# Patient Record
Sex: Female | Born: 1970 | Race: White | Hispanic: No | Marital: Married | Smoking: Never smoker
Health system: Southern US, Community
[De-identification: ages and names within clinical notes are randomized; demographics above are authoritative.]

---

## 1997-12-08 ENCOUNTER — Other Ambulatory Visit: Admission: RE | Admit: 1997-12-08 | Discharge: 1997-12-08 | Payer: Self-pay | Admitting: Obstetrics & Gynecology

## 1998-10-30 ENCOUNTER — Other Ambulatory Visit: Admission: RE | Admit: 1998-10-30 | Discharge: 1998-10-30 | Payer: Self-pay | Admitting: Gynecology

## 1999-11-06 ENCOUNTER — Other Ambulatory Visit: Admission: RE | Admit: 1999-11-06 | Discharge: 1999-11-06 | Payer: Self-pay | Admitting: General Practice

## 2000-11-25 ENCOUNTER — Other Ambulatory Visit: Admission: RE | Admit: 2000-11-25 | Discharge: 2000-11-25 | Payer: Self-pay | Admitting: Gynecology

## 2002-02-04 ENCOUNTER — Other Ambulatory Visit: Admission: RE | Admit: 2002-02-04 | Discharge: 2002-02-04 | Payer: Self-pay | Admitting: Gynecology

## 2003-02-15 ENCOUNTER — Other Ambulatory Visit: Admission: RE | Admit: 2003-02-15 | Discharge: 2003-02-15 | Payer: Self-pay | Admitting: Gynecology

## 2004-02-09 ENCOUNTER — Other Ambulatory Visit: Admission: RE | Admit: 2004-02-09 | Discharge: 2004-02-09 | Payer: Self-pay | Admitting: Gynecology

## 2005-02-05 ENCOUNTER — Other Ambulatory Visit: Admission: RE | Admit: 2005-02-05 | Discharge: 2005-02-05 | Payer: Self-pay | Admitting: Gynecology

## 2005-11-22 ENCOUNTER — Emergency Department (HOSPITAL_COMMUNITY): Admission: EM | Admit: 2005-11-22 | Discharge: 2005-11-22 | Payer: Self-pay | Admitting: Emergency Medicine

## 2006-01-06 ENCOUNTER — Encounter: Admission: RE | Admit: 2006-01-06 | Discharge: 2006-01-06 | Payer: Self-pay | Admitting: Gynecology

## 2006-01-23 ENCOUNTER — Encounter: Admission: RE | Admit: 2006-01-23 | Discharge: 2006-01-23 | Payer: Self-pay | Admitting: Gynecology

## 2006-02-05 ENCOUNTER — Other Ambulatory Visit: Admission: RE | Admit: 2006-02-05 | Discharge: 2006-02-05 | Payer: Self-pay | Admitting: Gynecology

## 2006-04-23 ENCOUNTER — Emergency Department (HOSPITAL_COMMUNITY): Admission: EM | Admit: 2006-04-23 | Discharge: 2006-04-23 | Payer: Self-pay | Admitting: Emergency Medicine

## 2006-07-09 ENCOUNTER — Encounter: Admission: RE | Admit: 2006-07-09 | Discharge: 2006-07-09 | Payer: Self-pay | Admitting: Gynecology

## 2006-11-06 IMAGING — MG MM DIAGNOSTIC LTD LEFT
3 series · 3 of 3 positions shown · non-contrast
Comparison: Screening mammogram dated 01-06-06.

DIGITAL LIMITED LEFT DIAGNOSTIC MAMMOGRAM AND LEFT BREAST ULTRASOUND:
CLINICAL DATA: Possible left breast mass at screening mammography.

[L CC]
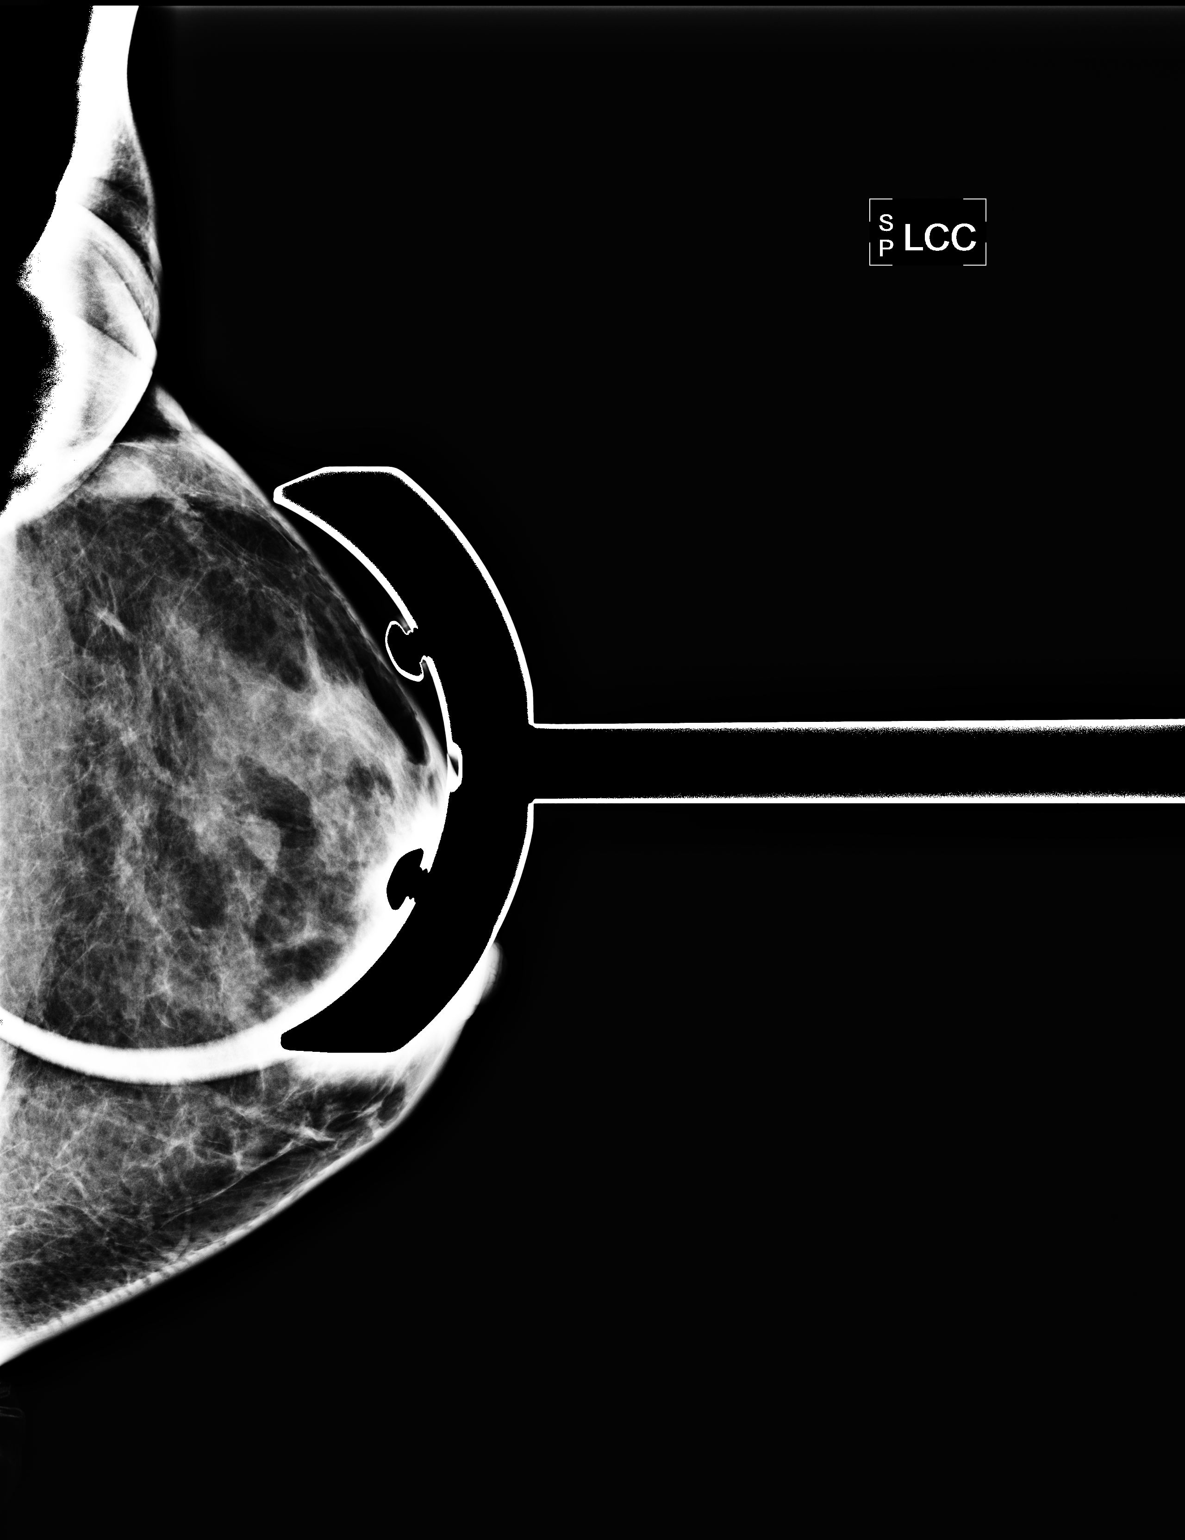

[L MLO (1 of 2)]
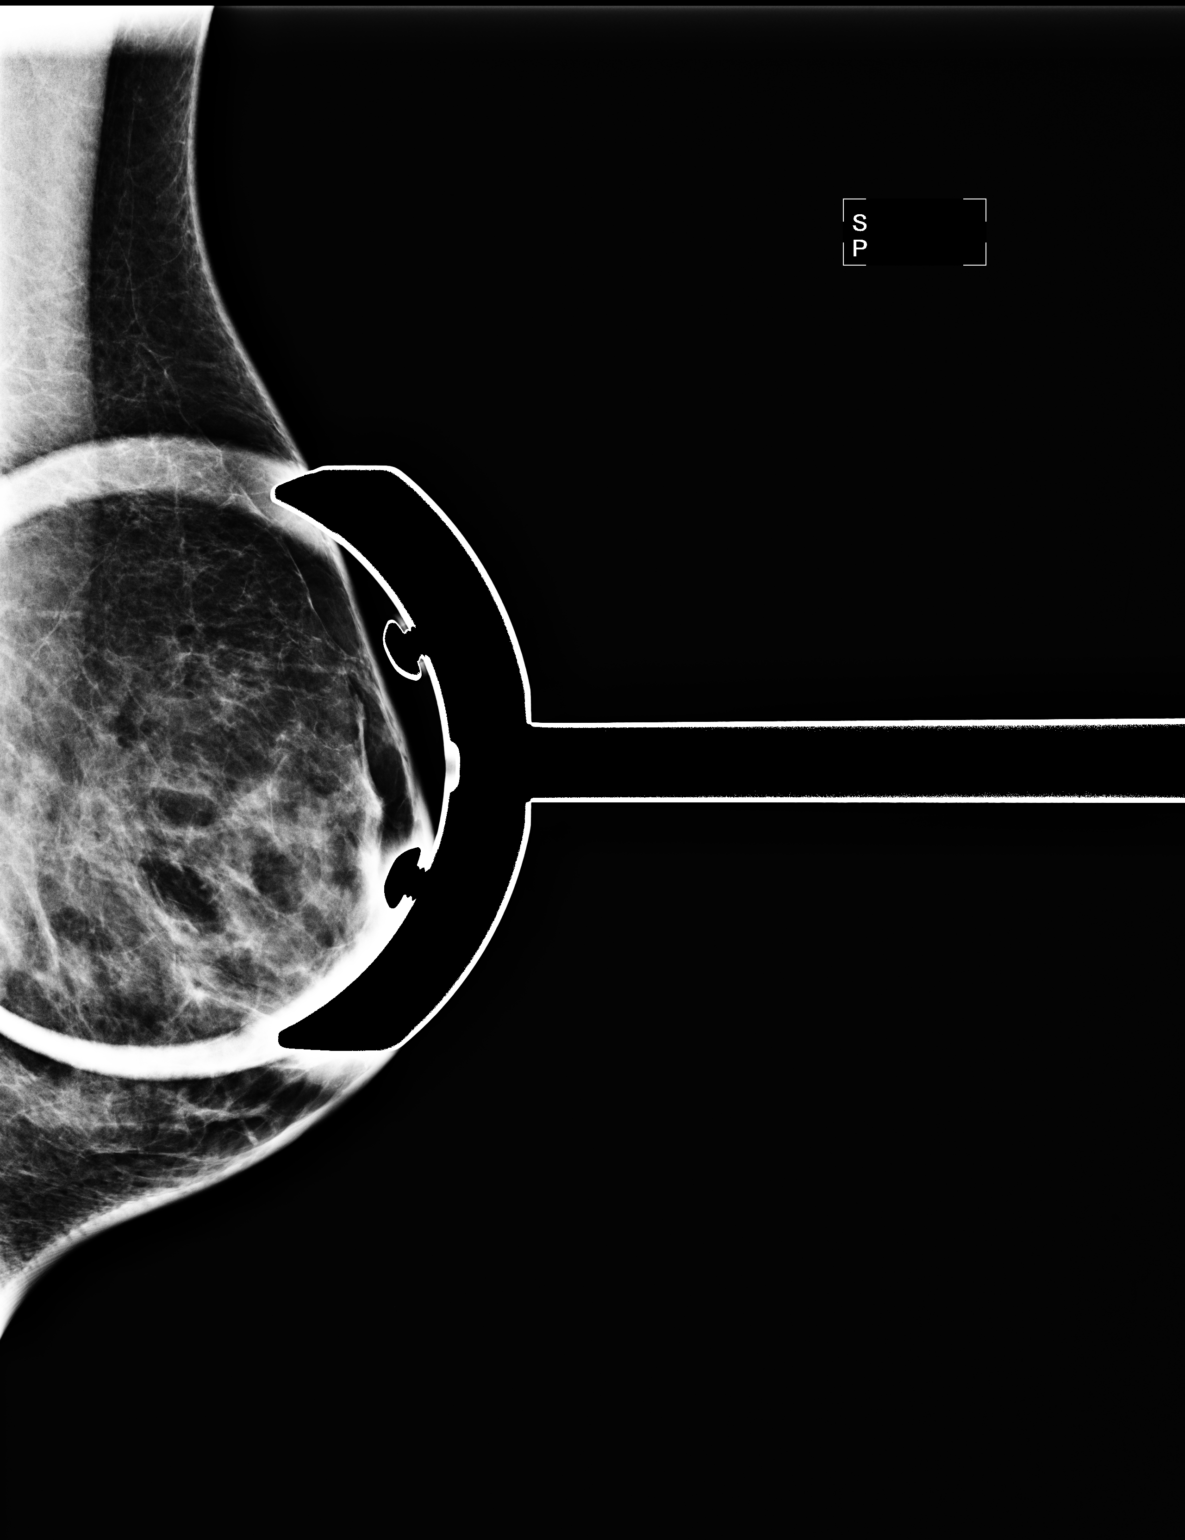

[L MLO (2 of 2)]
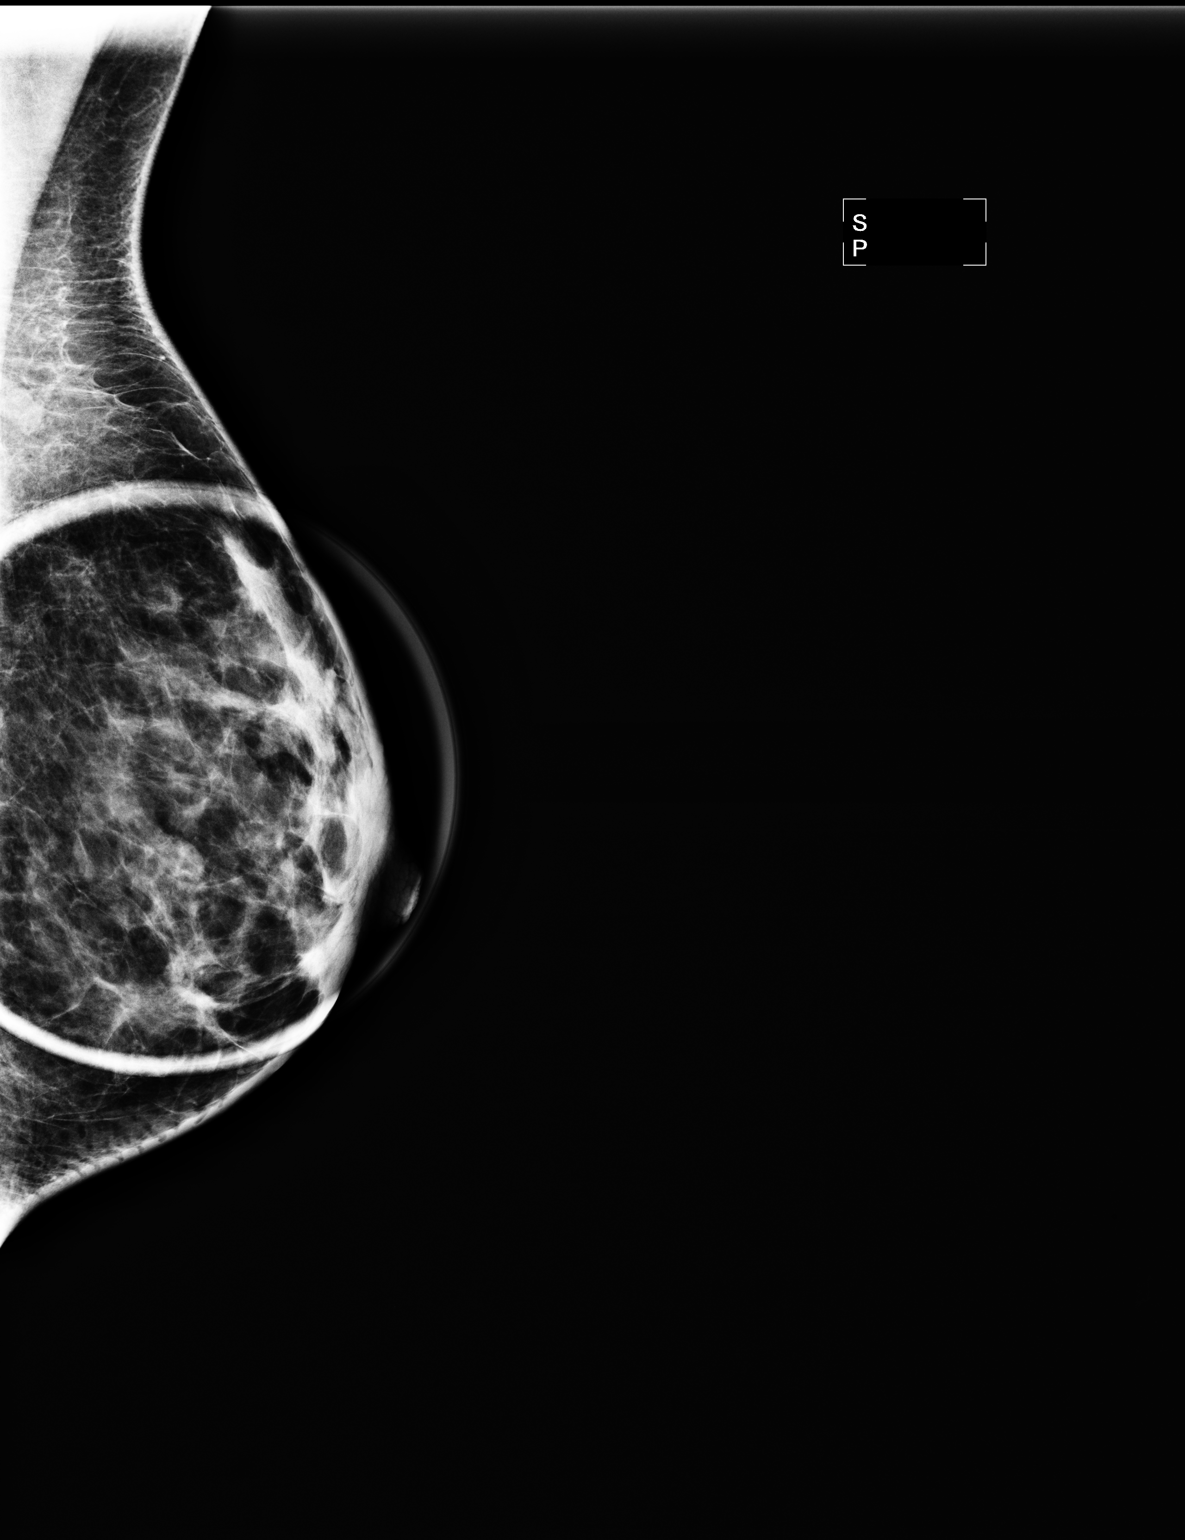

[3 of 3 positions shown; findings below may reference images not displayed]

Focal compression craniocaudal and two focal compression oblique views of the left breast were 
obtained.  These confirm a small, smoothly marginated nodule in the central aspect of the breast, 
slightly inferiorly, in the retroareolar region.  This is not palpable on physical examination 
today.  Sonographic examination of this area demonstrates a 5 x 4 x 4 mm oval, hypoechoic mass.  
This appears to contain a single small internal septation and low-level internal echoes.  There is 
some increased through transmission of sound associated with this mass.
IMPRESSION: 5 mm mildly complex cyst or fibroadenoma in the 6 o'clock retroareolar region of the left breast, 
approximately 1 cm from the nipple.  A follow-up ultrasound examination of the left breast is 
recommended in six months to assess stability.  This has been discussed with the patient.

ASSESSMENT: Probably benign - BI-RADS 3

Ultrasound of the left breast in 6 months.

## 2007-01-15 ENCOUNTER — Encounter: Admission: RE | Admit: 2007-01-15 | Discharge: 2007-01-15 | Payer: Self-pay | Admitting: Gynecology

## 2007-03-10 ENCOUNTER — Other Ambulatory Visit: Admission: RE | Admit: 2007-03-10 | Discharge: 2007-03-10 | Payer: Self-pay | Admitting: Gynecology

## 2007-08-27 ENCOUNTER — Other Ambulatory Visit: Admission: RE | Admit: 2007-08-27 | Discharge: 2007-08-27 | Payer: Self-pay | Admitting: Gynecology

## 2008-04-11 ENCOUNTER — Other Ambulatory Visit: Admission: RE | Admit: 2008-04-11 | Discharge: 2008-04-11 | Payer: Self-pay | Admitting: Gynecology

## 2009-04-25 ENCOUNTER — Emergency Department (HOSPITAL_BASED_OUTPATIENT_CLINIC_OR_DEPARTMENT_OTHER): Admission: EM | Admit: 2009-04-25 | Discharge: 2009-04-25 | Payer: Self-pay | Admitting: Emergency Medicine

## 2010-10-28 ENCOUNTER — Encounter: Payer: Self-pay | Admitting: Gynecology

## 2011-01-13 LAB — URINALYSIS, ROUTINE W REFLEX MICROSCOPIC
Glucose, UA: NEGATIVE mg/dL
Ketones, ur: NEGATIVE mg/dL
Protein, ur: >300 mg/dL — AB
Urobilinogen, UA: 1 mg/dL (ref 0.0–1.0)

## 2011-01-13 LAB — URINE MICROSCOPIC-ADD ON

## 2014-05-16 DIAGNOSIS — M9420 Chondromalacia, unspecified site: Secondary | ICD-10-CM | POA: Insufficient documentation

## 2014-05-16 DIAGNOSIS — E161 Other hypoglycemia: Secondary | ICD-10-CM | POA: Insufficient documentation

## 2014-05-16 DIAGNOSIS — M26649 Arthritis of unspecified temporomandibular joint: Secondary | ICD-10-CM | POA: Insufficient documentation

## 2014-05-16 DIAGNOSIS — G40209 Localization-related (focal) (partial) symptomatic epilepsy and epileptic syndromes with complex partial seizures, not intractable, without status epilepticus: Secondary | ICD-10-CM | POA: Insufficient documentation

## 2014-05-16 DIAGNOSIS — K219 Gastro-esophageal reflux disease without esophagitis: Secondary | ICD-10-CM | POA: Insufficient documentation

## 2014-05-16 DIAGNOSIS — J309 Allergic rhinitis, unspecified: Secondary | ICD-10-CM | POA: Insufficient documentation

## 2014-05-16 DIAGNOSIS — E559 Vitamin D deficiency, unspecified: Secondary | ICD-10-CM | POA: Insufficient documentation

## 2014-05-16 DIAGNOSIS — F411 Generalized anxiety disorder: Secondary | ICD-10-CM | POA: Insufficient documentation

## 2014-05-24 ENCOUNTER — Encounter (HOSPITAL_BASED_OUTPATIENT_CLINIC_OR_DEPARTMENT_OTHER): Payer: Self-pay | Admitting: Emergency Medicine

## 2014-05-24 DIAGNOSIS — R1033 Periumbilical pain: Secondary | ICD-10-CM | POA: Diagnosis not present

## 2014-05-24 DIAGNOSIS — R109 Unspecified abdominal pain: Secondary | ICD-10-CM | POA: Diagnosis present

## 2014-05-24 NOTE — ED Notes (Addendum)
Pt presents to ed with complaints of left flank pain that started tonight. Pt reports nausea but no vomiting.Pt states she has been having leakage at night for the past 2 weeks.

## 2014-05-25 ENCOUNTER — Emergency Department (HOSPITAL_BASED_OUTPATIENT_CLINIC_OR_DEPARTMENT_OTHER): Payer: Federal, State, Local not specified - PPO

## 2014-05-25 ENCOUNTER — Encounter (HOSPITAL_BASED_OUTPATIENT_CLINIC_OR_DEPARTMENT_OTHER): Payer: Self-pay | Admitting: Emergency Medicine

## 2014-05-25 ENCOUNTER — Emergency Department (HOSPITAL_BASED_OUTPATIENT_CLINIC_OR_DEPARTMENT_OTHER)
Admission: EM | Admit: 2014-05-25 | Discharge: 2014-05-25 | Disposition: A | Discharge status: Home / Self Care | Payer: Federal, State, Local not specified - PPO | Attending: Emergency Medicine | Admitting: Emergency Medicine

## 2014-05-25 DIAGNOSIS — R1033 Periumbilical pain: Secondary | ICD-10-CM

## 2014-05-25 LAB — URINE MICROSCOPIC-ADD ON

## 2014-05-25 LAB — CBC WITH DIFFERENTIAL/PLATELET
BASOS PCT: 0 % (ref 0–1)
Basophils Absolute: 0 10*3/uL (ref 0.0–0.1)
Eosinophils Absolute: 0.1 10*3/uL (ref 0.0–0.7)
Eosinophils Relative: 1 % (ref 0–5)
HEMATOCRIT: 41.4 % (ref 36.0–46.0)
HEMOGLOBIN: 13.7 g/dL (ref 12.0–15.0)
LYMPHS ABS: 2.3 10*3/uL (ref 0.7–4.0)
LYMPHS PCT: 30 % (ref 12–46)
MCH: 30.6 pg (ref 26.0–34.0)
MCHC: 33.1 g/dL (ref 30.0–36.0)
MCV: 92.4 fL (ref 78.0–100.0)
MONO ABS: 0.7 10*3/uL (ref 0.1–1.0)
MONOS PCT: 10 % (ref 3–12)
NEUTROS ABS: 4.5 10*3/uL (ref 1.7–7.7)
NEUTROS PCT: 59 % (ref 43–77)
Platelets: 207 10*3/uL (ref 150–400)
RBC: 4.48 MIL/uL (ref 3.87–5.11)
RDW: 13.7 % (ref 11.5–15.5)
WBC: 7.7 10*3/uL (ref 4.0–10.5)

## 2014-05-25 LAB — URINALYSIS, ROUTINE W REFLEX MICROSCOPIC
BILIRUBIN URINE: NEGATIVE
GLUCOSE, UA: NEGATIVE mg/dL
Hgb urine dipstick: NEGATIVE
KETONES UR: NEGATIVE mg/dL
Nitrite: NEGATIVE
PH: 6 (ref 5.0–8.0)
PROTEIN: NEGATIVE mg/dL
Specific Gravity, Urine: 1.005 (ref 1.005–1.030)
Urobilinogen, UA: 0.2 mg/dL (ref 0.0–1.0)

## 2014-05-25 LAB — BASIC METABOLIC PANEL
ANION GAP: 10 (ref 5–15)
BUN: 14 mg/dL (ref 6–23)
CHLORIDE: 102 meq/L (ref 96–112)
CO2: 27 meq/L (ref 19–32)
CREATININE: 0.8 mg/dL (ref 0.50–1.10)
Calcium: 9.5 mg/dL (ref 8.4–10.5)
GFR calc Af Amer: >90 mL/min (ref >90)
GFR calc non Af Amer: 88 mL/min — ABNORMAL LOW (ref >90)
Glucose, Bld: 98 mg/dL (ref 70–99)
POTASSIUM: 3.8 meq/L (ref 3.7–5.3)
Sodium: 139 mEq/L (ref 137–147)

## 2014-05-25 LAB — LIPASE, BLOOD: LIPASE: 40 U/L (ref 11–59)

## 2014-05-25 MED ORDER — OXYCODONE-ACETAMINOPHEN 5-325 MG PO TABS: 1.0000 | ORAL_TABLET | Freq: Four times a day (QID) | ORAL | Status: AC | PRN

## 2014-05-25 MED ORDER — FENTANYL CITRATE 0.05 MG/ML IJ SOLN: 100.0000 ug | Freq: Once | INTRAMUSCULAR | Status: DC

## 2014-05-25 MED ORDER — KETOROLAC TROMETHAMINE 30 MG/ML IJ SOLN
30.0000 mg | Freq: Once | INTRAMUSCULAR | Status: AC
  Administered 2014-05-25: 30 mg via INTRAVENOUS
  Filled 2014-05-25: qty 1

## 2014-05-25 NOTE — ED Notes (Signed)
Patient transported to CT 

## 2014-05-25 NOTE — ED Provider Notes (Addendum)
CSN: [id number]     Arrival date & time 05/24/14  2325 History   First MD Initiated Contact with Patient 05/25/14 0325     Chief Complaint  Patient presents with  . Flank Pain     (Consider location/radiation/quality/duration/timing/severity/associated sxs/prior Treatment) Patient is a 43 y.o. female presenting with flank pain. The history is provided by the patient.  Flank Pain This is a new problem. The current episode started 6 to 12 hours ago. The problem occurs constantly. The problem has been gradually improving. Associated symptoms include abdominal pain. Pertinent negatives include no chest pain, no headaches and no shortness of breath. Associated symptoms comments: LMQ and periumbical. Nothing aggravates the symptoms. Nothing relieves the symptoms. She has tried nothing for the symptoms. The treatment provided no relief.    History reviewed. No pertinent past medical history. History reviewed. No pertinent past surgical history. History reviewed. No pertinent family history. History  Substance Use Topics  . Smoking status: Never Smoker   . Smokeless tobacco: Not on file  . Alcohol Use: Not on file   OB History   Grav Para Term Preterm Abortions TAB SAB Ect Mult Living                 Review of Systems  Constitutional: Negative for fever.  Respiratory: Negative for shortness of breath.   Cardiovascular: Negative for chest pain.  Gastrointestinal: Positive for abdominal pain. Negative for nausea, vomiting, diarrhea, constipation and abdominal distention.  Genitourinary: Positive for flank pain. Negative for dysuria.  Neurological: Negative for headaches.  All other systems reviewed and are negative.     Allergies  Codeine and Erythromycin  Home Medications   Prior to Admission medications   Medication Sig Start Date End Date Taking? Authorizing Provider  oxyCODONE-acetaminophen (PERCOCET) 5-325 MG per tablet Take 1-2 tablets by mouth every 6 (six) hours as  needed for severe pain. 05/25/14   Nakeem Murnane K Izaya Netherton-Rasch, MD   BP 106/50  Pulse 77  Temp(Src) 97.9 F (36.6 C) (Oral)  Resp 15  Ht 5\' 5"  (1.651 m)  Wt 200 lb (90.719 kg)  BMI 33.28 kg/m2  SpO2 95%  LMP 05/16/2014 Physical Exam  Constitutional: She is oriented to person, place, and time. She appears well-developed and well-nourished. No distress.  HENT:  Head: Normocephalic and atraumatic.  Mouth/Throat: Oropharynx is clear and moist.  Eyes: Conjunctivae are normal. Pupils are equal, round, and reactive to light.  Neck: Normal range of motion. Neck supple.  Cardiovascular: Normal rate, regular rhythm and intact distal pulses.   Pulmonary/Chest: Effort normal and breath sounds normal. She has no wheezes. She has no rales.  Abdominal: Soft. Bowel sounds are normal. There is no tenderness. There is no rebound and no guarding.  Musculoskeletal: Normal range of motion.  Neurological: She is alert and oriented to person, place, and time.  Skin: Skin is warm and dry.  Psychiatric: She has a normal mood and affect.    ED Course  Procedures (including critical care time) Labs Review Labs Reviewed  URINALYSIS, ROUTINE W REFLEX MICROSCOPIC - Abnormal; Notable for the following:    APPearance CLOUDY (*)    Leukocytes, UA SMALL (*)    All other components within normal limits  BASIC METABOLIC PANEL - Abnormal; Notable for the following:    GFR calc non Af Amer 88 (*)    All other components within normal limits  URINE MICROSCOPIC-ADD ON  CBC WITH DIFFERENTIAL  LIPASE, BLOOD    Imaging Review Ct Abdomen  Pelvis Wo Contrast  05/25/2014   CLINICAL DATA:  Left flank pain and back pain  EXAM: CT ABDOMEN AND PELVIS WITHOUT CONTRAST  TECHNIQUE: Multidetector CT imaging of the abdomen and pelvis was performed following the standard protocol without IV contrast.  COMPARISON:  None.  FINDINGS: BODY WALL: Unremarkable.  LOWER CHEST: Unremarkable.  ABDOMEN/PELVIS:  Liver: No focal abnormality.   Biliary: No evidence of biliary obstruction or stone.  Pancreas: Unremarkable.  Spleen: Unremarkable.  Adrenals: Unremarkable.  Kidneys and ureters: 2 mm calculus in the lower pole left kidney. No hydronephrosis or ureteral calculus.  Bladder: Unremarkable.  Reproductive: Unremarkable.  Bowel: No obstruction. Negative appendix.  Retroperitoneum: No mass or adenopathy.  Peritoneum: No ascites or pneumoperitoneum.  Vascular: No acute abnormality.  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 No acute osseous findings.  IMPRESSION: 1. No hydronephrosis, ureteral calculus, or other cause for left flank pain. 2. 2 mm left nephrolithiasis. 3. L5 pars defects with slight anterolisthesis.   Electronically Signed   By: Jorje Guild M.D.   On: 05/25/2014 03:52     EKG Interpretation None      MDM   Final diagnoses:  Periumbilical abdominal pain    No acute findings on labs or CT scan suspect cramping from gas as patient has hyperactive bowel sounds will treat with pain medication follow up with your PMD.  Patient and husband verbalize understanding and agree to follow up    Kindred Heying K Tiarrah Saville-Rasch, MD 05/25/14 0655  Misbah Hornaday K Renji Berwick-Rasch, MD 05/25/14 [phone]

## 2014-05-25 NOTE — Discharge Instructions (Signed)

## 2014-05-27 DIAGNOSIS — N200 Calculus of kidney: Secondary | ICD-10-CM | POA: Insufficient documentation

## 2014-07-02 DIAGNOSIS — M129 Arthropathy, unspecified: Secondary | ICD-10-CM | POA: Insufficient documentation

## 2014-12-02 DIAGNOSIS — E785 Hyperlipidemia, unspecified: Secondary | ICD-10-CM | POA: Insufficient documentation

## 2015-03-08 IMAGING — CT CT ABD-PELV W/O CM
2 of 4 series · 17 of 46 positions shown, 19 images · non-contrast
Comparison: None.

CLINICAL DATA: Left flank pain and back pain

EXAM:
CT ABDOMEN AND PELVIS WITHOUT CONTRAST
TECHNIQUE: Multidetector CT imaging of the abdomen and pelvis was performed
following the standard protocol without IV contrast.

[Series 2: renal stone < 200 lbs 5.0 b31f · axial · 0.89mm/px · z∈[+891,+1291]mm · 14 of 88 slices shown, 16 images]
[im 4/88  soft-tissue]
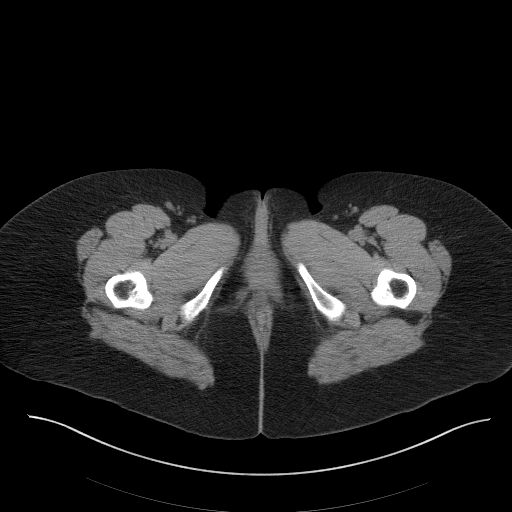
[im 4/88  bone]
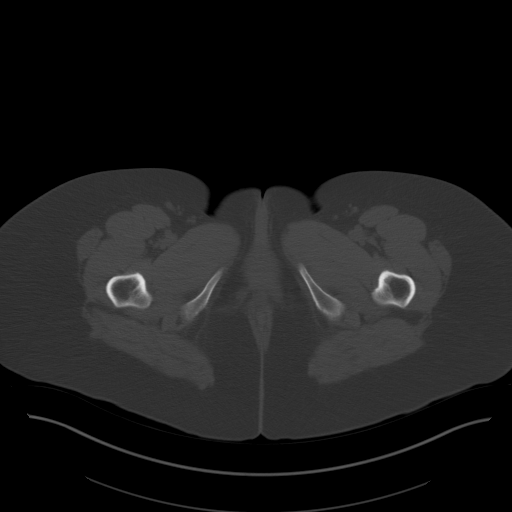
[im 11/88  soft-tissue]
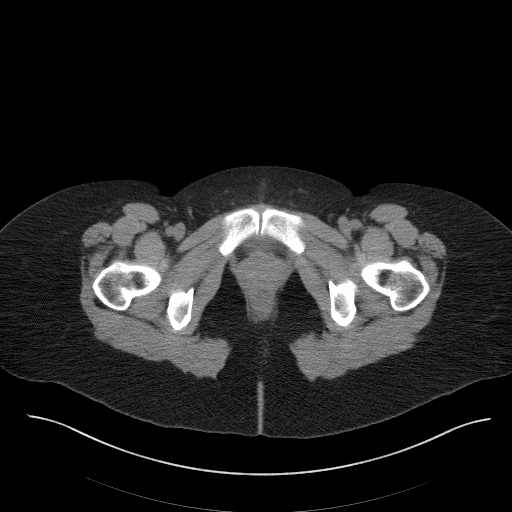
[im 19/88  soft-tissue]
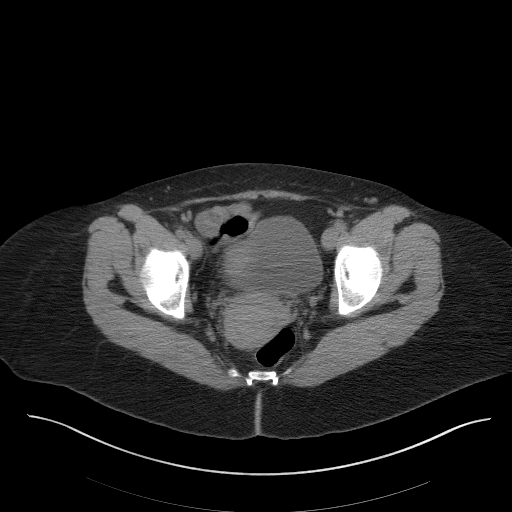
[im 22/88  soft-tissue]
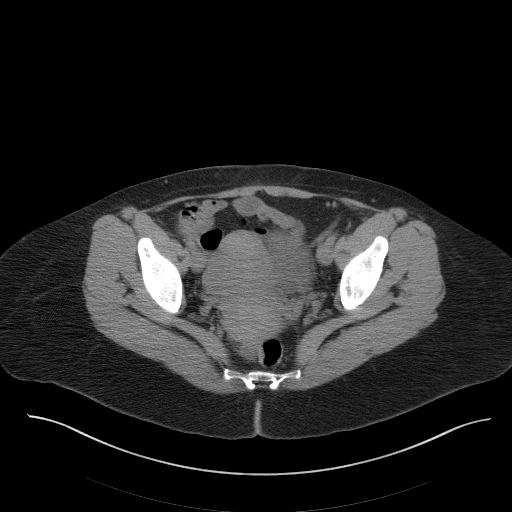
[im 30/88  soft-tissue]
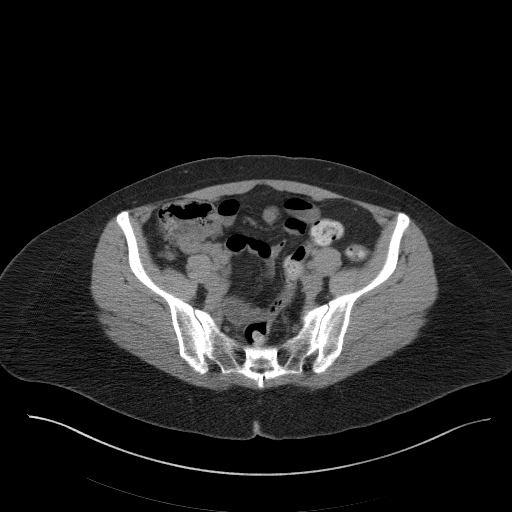
[im 37/88  soft-tissue]
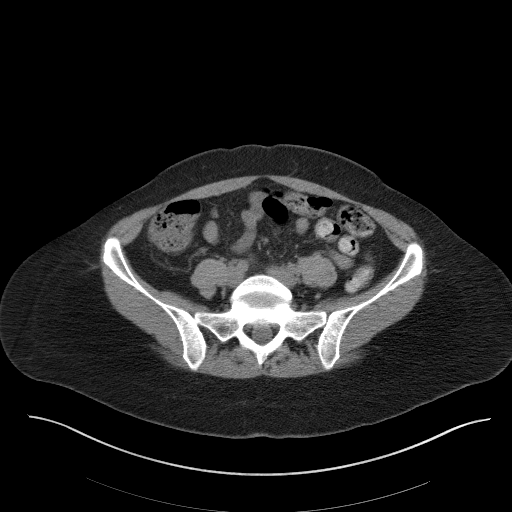
[im 40/88  soft-tissue]
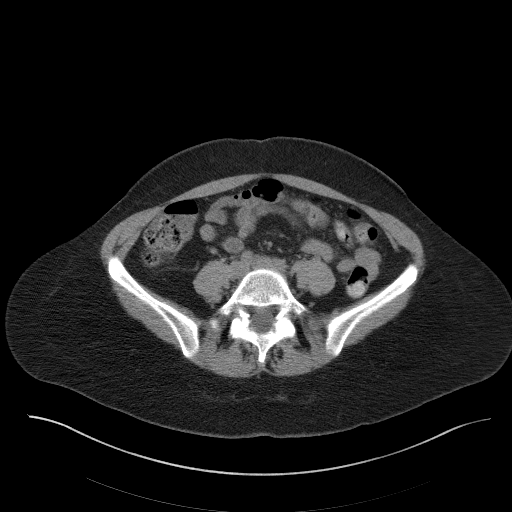
[im 48/88  soft-tissue]
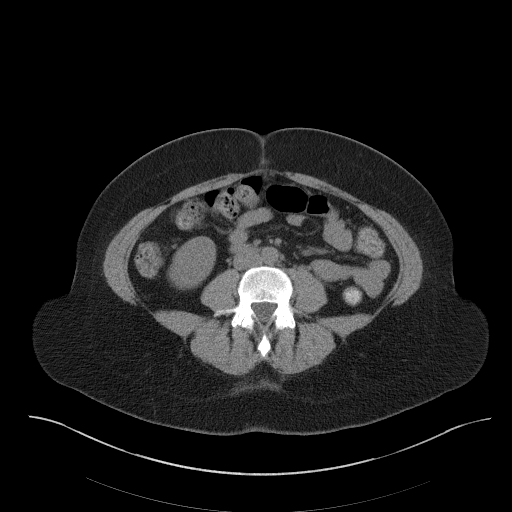
[im 51/88  soft-tissue]
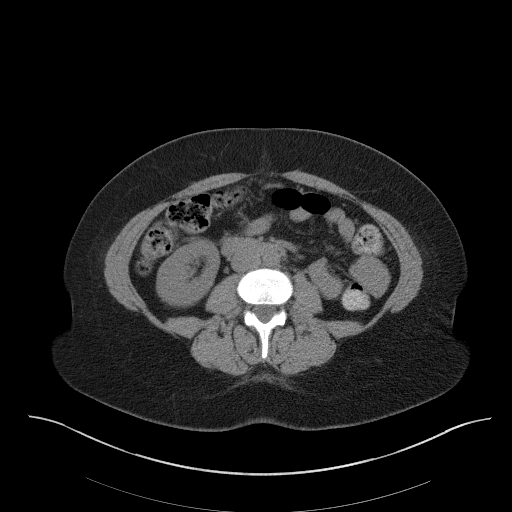
[im 51/88  bone]
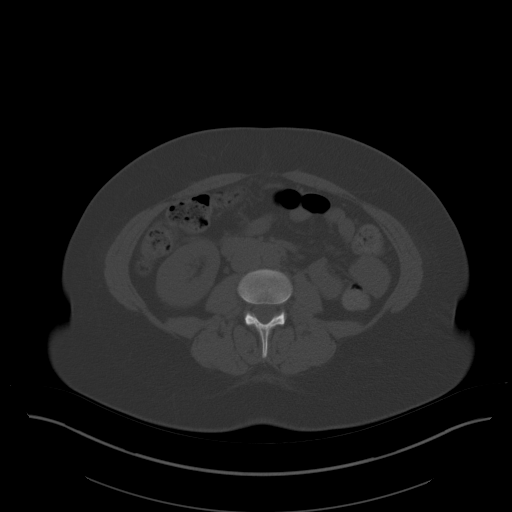
[im 59/88  soft-tissue]
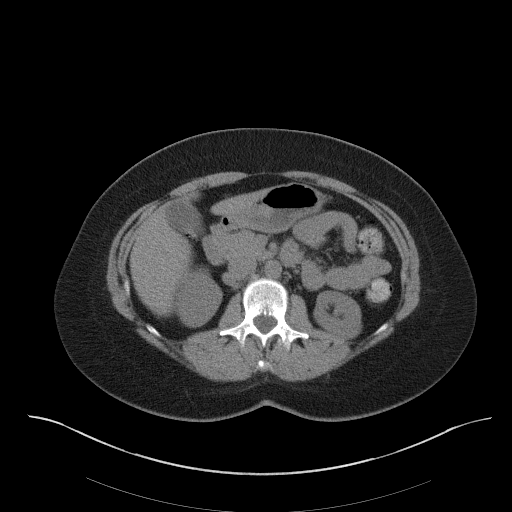
[im 66/88  soft-tissue]
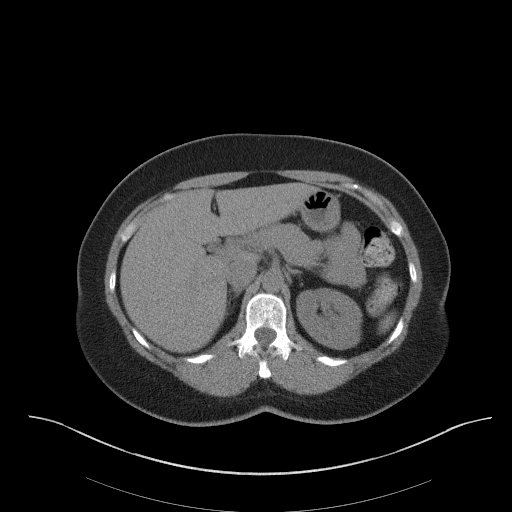
[im 69/88  soft-tissue]
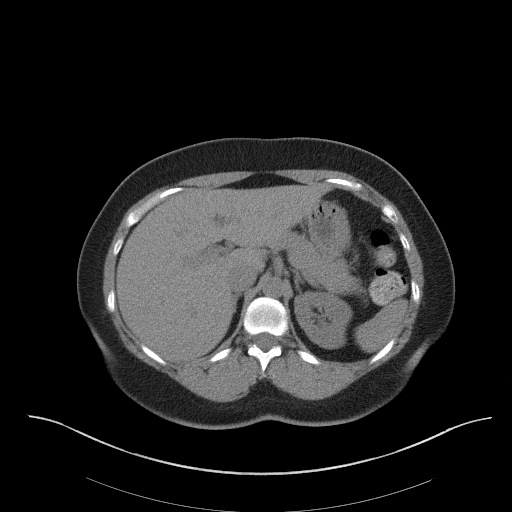
[im 77/88  soft-tissue]
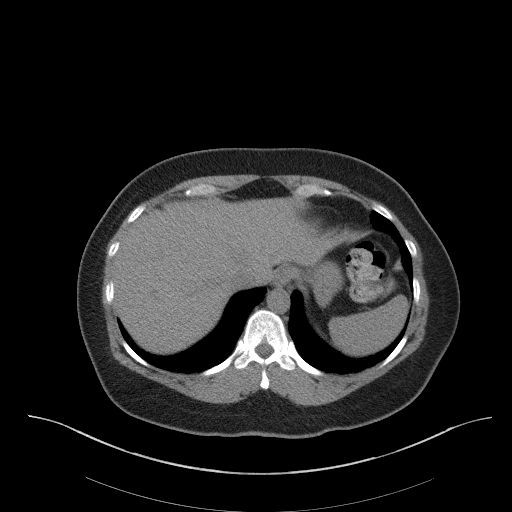
[im 84/88  soft-tissue]
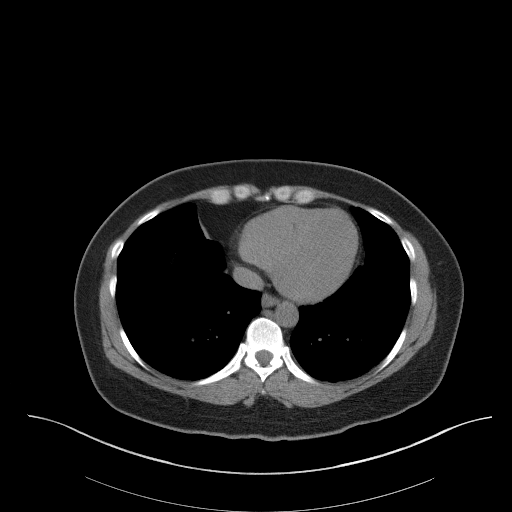

[Series 5: renal stone 3.0 coronal · coronal · 0.73mm/px · 3 of 83 slices shown]
[im 28/83  soft-tissue]
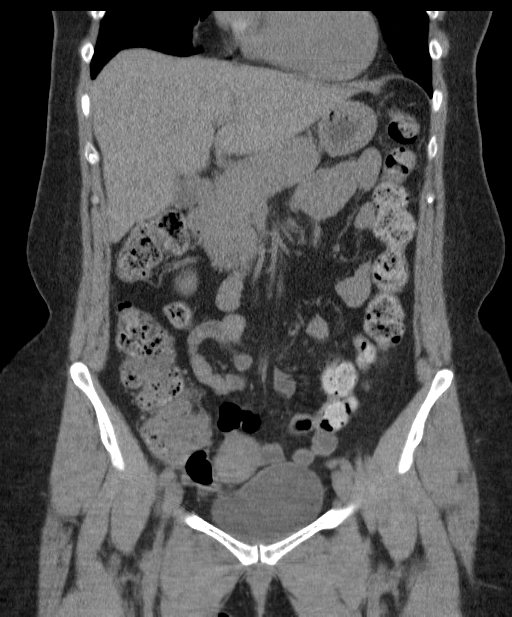
[im 37/83  soft-tissue]
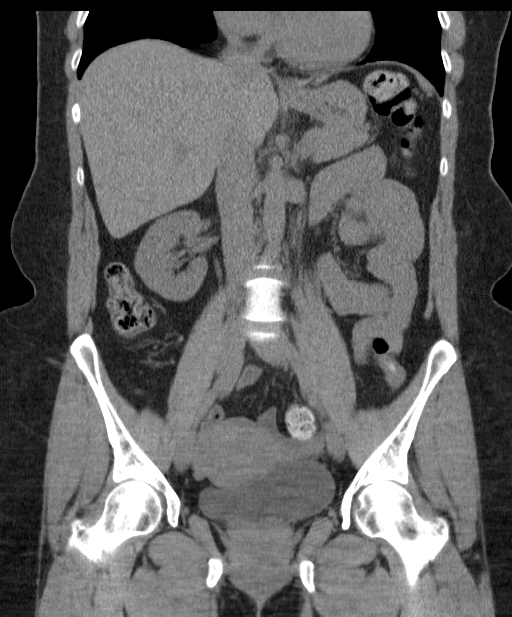
[im 46/83  soft-tissue]
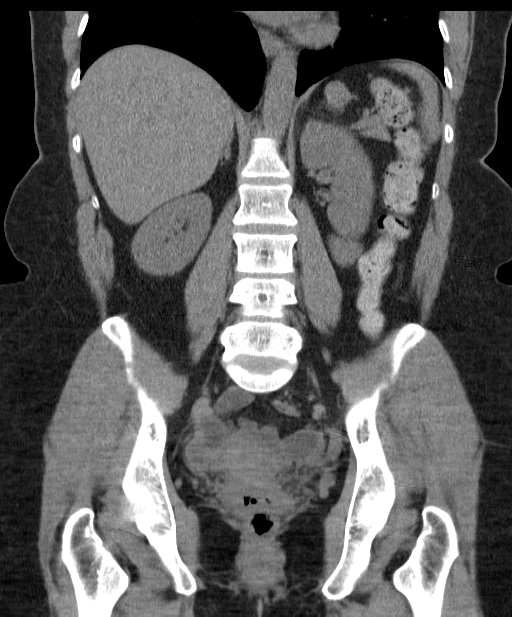

[17 of 46 positions shown; findings below may reference images not displayed]

FINDINGS: BODY WALL: Unremarkable.

LOWER CHEST: Unremarkable.

ABDOMEN/PELVIS:

Liver: No focal abnormality.

Biliary: No evidence of biliary obstruction or stone.

Pancreas: Unremarkable.

Spleen: Unremarkable.

Adrenals: Unremarkable.

Kidneys and ureters: 2 mm calculus in the lower pole left kidney. No
hydronephrosis or ureteral calculus.

Bladder: Unremarkable.

Reproductive: Unremarkable.

Bowel: No obstruction. Negative appendix.

Retroperitoneum: No mass or adenopathy.

Peritoneum: No ascites or pneumoperitoneum.

Vascular: No acute abnormality.

OSSEOUS: Bilateral L5 pars defects with slight anterolisthesis. No
acute osseous findings.
IMPRESSION: 1. No hydronephrosis, ureteral calculus, or other cause for left
flank pain.
2. 2 mm left nephrolithiasis.
3. L5 pars defects with slight anterolisthesis.

## 2016-02-19 DIAGNOSIS — Z8669 Personal history of other diseases of the nervous system and sense organs: Secondary | ICD-10-CM | POA: Insufficient documentation

## 2016-03-12 DIAGNOSIS — K8020 Calculus of gallbladder without cholecystitis without obstruction: Secondary | ICD-10-CM | POA: Insufficient documentation

## 2017-04-14 DIAGNOSIS — Z6838 Body mass index (BMI) 38.0-38.9, adult: Secondary | ICD-10-CM | POA: Insufficient documentation

## 2017-04-14 DIAGNOSIS — H9190 Unspecified hearing loss, unspecified ear: Secondary | ICD-10-CM | POA: Insufficient documentation

## 2017-04-14 DIAGNOSIS — G43909 Migraine, unspecified, not intractable, without status migrainosus: Secondary | ICD-10-CM | POA: Insufficient documentation

## 2017-04-14 DIAGNOSIS — F32A Depression, unspecified: Secondary | ICD-10-CM | POA: Insufficient documentation

## 2017-04-14 DIAGNOSIS — E6609 Other obesity due to excess calories: Secondary | ICD-10-CM | POA: Insufficient documentation

## 2017-04-14 DIAGNOSIS — F419 Anxiety disorder, unspecified: Secondary | ICD-10-CM | POA: Insufficient documentation

## 2018-12-15 DIAGNOSIS — R232 Flushing: Secondary | ICD-10-CM | POA: Insufficient documentation

## 2018-12-15 DIAGNOSIS — R102 Pelvic and perineal pain: Secondary | ICD-10-CM | POA: Insufficient documentation

## 2018-12-15 DIAGNOSIS — Z3041 Encounter for surveillance of contraceptive pills: Secondary | ICD-10-CM | POA: Insufficient documentation

## 2018-12-15 DIAGNOSIS — D251 Intramural leiomyoma of uterus: Secondary | ICD-10-CM | POA: Insufficient documentation

## 2018-12-15 DIAGNOSIS — D219 Benign neoplasm of connective and other soft tissue, unspecified: Secondary | ICD-10-CM | POA: Insufficient documentation

## 2021-12-02 ENCOUNTER — Ambulatory Visit
Admission: RE | Admit: 2021-12-02 | Discharge: 2021-12-02 | Disposition: A | Discharge status: Home / Self Care | Payer: Federal, State, Local not specified - PPO | Source: Ambulatory Visit

## 2021-12-02 ENCOUNTER — Other Ambulatory Visit: Payer: Self-pay

## 2021-12-02 VITALS — BP 106/73 | HR 83 | Temp 98.2°F | Resp 18

## 2021-12-02 DIAGNOSIS — J209 Acute bronchitis, unspecified: Secondary | ICD-10-CM

## 2021-12-02 DIAGNOSIS — J0140 Acute pansinusitis, unspecified: Secondary | ICD-10-CM | POA: Diagnosis not present

## 2021-12-02 MED ORDER — AMOXICILLIN 875 MG PO TABS: 875.0000 mg | ORAL_TABLET | Freq: Two times a day (BID) | ORAL | 0 refills | Status: AC

## 2021-12-02 MED ORDER — PROMETHAZINE-DM 6.25-15 MG/5ML PO SYRP: 5.0000 mL | ORAL_SOLUTION | Freq: Three times a day (TID) | ORAL | 0 refills | Status: AC | PRN

## 2021-12-02 MED ORDER — PREDNISONE 20 MG PO TABS: 20.0000 mg | ORAL_TABLET | Freq: Every day | ORAL | 0 refills | Status: AC

## 2021-12-02 NOTE — ED Provider Notes (Signed)
[name]    CSN: [id number] Arrival date & time: 12/02/21  1013      History   Chief Complaint Chief Complaint  Patient presents with   Nasal Congestion   Cough    HPI Rhonda Hampton is a 51 y.o. female.   HPI Patient here with cough and congestion for one week. Patient reports congestion in her chest has worsened to include shortness of breath over the last few days. She has checked her oxygen at home and saturations have been in the low 92-94% which is less than her baseline. She has taken two COVID test which both were negative. Endorses a persistent cough which is occasionally productive.   History reviewed. No pertinent past medical history.  Patient Active Problem List   Diagnosis Date Noted   Fibroids 12/15/2018   Hot flashes 12/15/2018   Intramural uterine fibroid 12/15/2018   Oral contraceptive pill surveillance 12/15/2018   Pelvic pain 12/15/2018   Anxiety 04/14/2017   Depression 04/14/2017   Class 2 obesity due to excess calories without serious comorbidity with body mass index (BMI) of 38.0 to 38.9 in adult 04/14/2017   Hearing loss 04/14/2017   Migraines 04/14/2017   Cholelithiasis without cholecystitis 03/12/2016   History of retinal detachment 02/19/2016   Hyperlipidemia LDL goal <130 12/02/2014   Arthropathy, unspecified, other specified sites 07/02/2014   Kidney stone on left side 05/27/2014   Allergic rhinitis 05/16/2014   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 No pertinent surgical history.  OB History   No obstetric history on file.      Home Medications    Prior to Admission medications   Medication Sig Start Date End Date Taking? Authorizing Provider  celecoxib (CELEBREX) 200  MG capsule Take by mouth. 05/25/21  Yes [provider]  clonazePAM (KLONOPIN) 0.5 MG tablet Take by mouth. 11/28/14  Yes [provider]  norethindrone (MICRONOR) 0.35 MG tablet Take by mouth. 11/08/19  Yes [provider]  SUMAtriptan (IMITREX) 100 MG tablet Take by mouth. 09/27/16 01/22/22 Yes [provider]  atorvastatin (LIPITOR) 20 MG tablet Take 20 mg by mouth daily. 11/26/21   [provider]  escitalopram (LEXAPRO) 10 MG tablet Take 10 mg by mouth daily. 11/08/21   [provider]  levothyroxine (SYNTHROID) 50 MCG tablet Take 50 mcg by mouth daily. 11/02/21   [provider]  oxyCODONE-acetaminophen (PERCOCET) 5-325 MG per tablet Take 1-2 tablets by mouth every 6 (six) hours as needed for severe pain. 05/25/14   Palumbo, April, MD  SUTAB [phone] MG TABS SMARTSIG:12 Tablet(s) By Mouth Daily 08/27/21   [provider]  Vitamin D, Ergocalciferol, (DRISDOL) 1.25 MG (50000 UNIT) CAPS capsule Take 50,000 Units by mouth once a week. 09/02/21   [provider]    Family History History reviewed. No pertinent family history.  Social History Social History   Tobacco Use   Smoking status: Never  Substance Use Topics   Alcohol use: Not Currently   Drug use: Never     Allergies   Codeine and Erythromycin   Review of Systems Review of Systems Pertinent negatives listed in HPI   Physical Exam Triage Vital Signs ED Triage Vitals  Enc Vitals Group     BP 12/02/21 1025 106/73  Pulse Rate 12/02/21 1025 83     Resp 12/02/21 1025 18     Temp 12/02/21 1025 98.2 F (36.8 C)     Temp src --      SpO2 12/02/21 1025 94 %     Weight --      Height --      Head Circumference --      Peak Flow --      Pain Score 12/02/21 1031 0     Pain Loc --      Pain Edu? --      Excl. in [place]? --    No data found.  Updated Vital Signs BP 106/73    Pulse 83    Temp 98.2 F (36.8 C)    Resp 18    SpO2 94%   Visual  Acuity Right Eye Distance:   Left Eye Distance:   Bilateral Distance:    Right Eye Near:   Left Eye Near:    Bilateral Near:     Physical Exam  General Appearance:    Alert, acutely ill-appearing, cooperative, no distress  HENT:   Normocephalic, ears normal, nares mucosal edema with congestion, rhinorrhea, oropharynx without erythema   Eyes:    PERRL, conjunctiva/corneas clear, EOM's intact       Lungs:     Coarse b/l upper lung fields, clear to auscultation bilaterally, respirations unlabored  Heart:    Regular rate and rhythm  Neurologic:   Awake, alert, oriented x 3. No apparent focal neurological           defect.      UC Treatments / Results  Labs (all labs ordered are listed, but only abnormal results are displayed) Labs Reviewed - No data to display  EKG   Radiology No results found.  Procedures Procedures (including critical care time)  Medications Ordered in UC Medications - No data to display  Initial Impression / Assessment and Plan / UC Course  I have reviewed the triage vital signs and the nursing notes.  Pertinent labs & imaging results that were available during my care of the patient were reviewed by me and considered in my medical decision making (see chart for details).    Acute non-recurrent pansinusitis with acute bronchitis  Treatment with prednisone 20 mg daily x 5 days. Promethazine DM for cough Amoxicillin 875 mg BID  7 days RTC if symptoms do not readily improve  Final Clinical Impressions(s) / UC Diagnoses   Final diagnoses:  Acute non-recurrent pansinusitis  Acute bronchitis, unspecified organism   Discharge Instructions   None    ED Prescriptions     Medication Sig Dispense Auth. Provider   predniSONE (DELTASONE) 20 MG tablet Take 1 tablet (20 mg total) by mouth daily with breakfast for 5 days. 5 tablet Scot Jun, FNP   promethazine-dextromethorphan (PROMETHAZINE-DM) 6.25-15 MG/5ML syrup Take 5 mLs by mouth 3 (three)  times daily as needed for cough. 180 mL Scot Jun, FNP   amoxicillin (AMOXIL) 875 MG tablet Take 1 tablet (875 mg total) by mouth 2 (two) times daily for 7 days. 14 tablet Scot Jun, FNP      PDMP not reviewed this encounter.   Scot Jun, [place] 12/02/21 1208

## 2021-12-02 NOTE — ED Triage Notes (Signed)
Pt here with cough and congestion x 1 week.

## 2021-12-18 ENCOUNTER — Ambulatory Visit: Payer: Federal, State, Local not specified - PPO

## 2022-02-23 ENCOUNTER — Other Ambulatory Visit: Payer: Self-pay

## 2022-02-23 ENCOUNTER — Ambulatory Visit
Admission: EM | Admit: 2022-02-23 | Discharge: 2022-02-23 | Disposition: A | Discharge status: Home / Self Care | Payer: Federal, State, Local not specified - PPO | Attending: Emergency Medicine | Admitting: Emergency Medicine

## 2022-02-23 ENCOUNTER — Encounter: Payer: Self-pay | Admitting: Emergency Medicine

## 2022-02-23 DIAGNOSIS — R319 Hematuria, unspecified: Secondary | ICD-10-CM | POA: Insufficient documentation

## 2022-02-23 DIAGNOSIS — R300 Dysuria: Secondary | ICD-10-CM

## 2022-02-23 DIAGNOSIS — N390 Urinary tract infection, site not specified: Secondary | ICD-10-CM | POA: Diagnosis present

## 2022-02-23 LAB — POCT URINALYSIS DIP (MANUAL ENTRY)
Bilirubin, UA: NEGATIVE
Glucose, UA: NEGATIVE mg/dL
Ketones, POC UA: NEGATIVE mg/dL
Nitrite, UA: NEGATIVE
Protein Ur, POC: 100 mg/dL — AB
Spec Grav, UA: 1.02 (ref 1.010–1.025)
Urobilinogen, UA: 1 E.U./dL
pH, UA: 7.5 (ref 5.0–8.0)

## 2022-02-23 MED ORDER — CEPHALEXIN 500 MG PO CAPS: 500.0000 mg | ORAL_CAPSULE | Freq: Two times a day (BID) | ORAL | 0 refills | Status: AC

## 2022-02-23 NOTE — ED Provider Notes (Signed)
[name]    CSN: [id number] Arrival date & time: 02/23/22  1156      History   Chief Complaint Chief Complaint  Patient presents with   Urinary Frequency    Bladder/Kidney infection with blood - Entered by patient   Urinary Tract Infection    HPI Rhonda Hampton is a 51 y.o. female.  Patient presents with 6-day history of dysuria and urinary frequency.  She noted blood in her urine since yesterday.  She denies fever, chills, abdominal pain, flank pain, vaginal discharge, pelvic pain, or other symptoms.  No treatments at home.  The history is provided by the patient and medical records.   History reviewed. No pertinent past medical history.  Patient Active Problem List   Diagnosis Date Noted   Fibroids 12/15/2018   Hot flashes 12/15/2018   Intramural uterine fibroid 12/15/2018   Oral contraceptive pill surveillance 12/15/2018   Pelvic pain 12/15/2018   Anxiety 04/14/2017   Depression 04/14/2017   Class 2 obesity due to excess calories without serious comorbidity with body mass index (BMI) of 38.0 to 38.9 in adult 04/14/2017   Hearing loss 04/14/2017   Migraines 04/14/2017   Cholelithiasis without cholecystitis 03/12/2016   History of retinal detachment 02/19/2016   Hyperlipidemia LDL goal <130 12/02/2014   Arthropathy, unspecified, other specified sites 07/02/2014   Kidney stone on left side 05/27/2014   Allergic rhinitis 05/16/2014   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 No pertinent surgical history.  OB History   No obstetric history on file.      Home Medications    Prior to Admission medications   Medication Sig Start Date End Date Taking? Authorizing Provider  cephALEXin (KEFLEX) 500 MG capsule  Take 1 capsule (500 mg total) by mouth 2 (two) times daily for 5 days. 02/23/22 02/28/22 Yes Sharion Balloon, NP  atorvastatin (LIPITOR) 20 MG tablet Take 20 mg by mouth daily. 11/26/21   [provider]  celecoxib (CELEBREX) 200 MG capsule Take by mouth. 05/25/21   [provider]  clonazePAM (KLONOPIN) 0.5 MG tablet Take by mouth. 11/28/14   [provider]  escitalopram (LEXAPRO) 10 MG tablet Take 10 mg by mouth daily. 11/08/21   [provider]  levothyroxine (SYNTHROID) 50 MCG tablet Take 50 mcg by mouth daily. 11/02/21   [provider]  norethindrone (MICRONOR) 0.35 MG tablet Take by mouth. Patient not taking: Reported on 02/23/2022 11/08/19   [provider]  oxyCODONE-acetaminophen (PERCOCET) 5-325 MG per tablet Take 1-2 tablets by mouth every 6 (six) hours as needed for severe pain. Patient not taking: Reported on 02/23/2022 05/25/14   Palumbo, April, MD  promethazine-dextromethorphan (PROMETHAZINE-DM) 6.25-15 MG/5ML syrup Take 5 mLs by mouth 3 (three) times daily as needed for cough. 12/02/21   Scot Jun, FNP  SUMAtriptan (IMITREX) 100 MG tablet Take by mouth. 09/27/16 01/22/22  [provider]  SUTAB [phone] MG TABS SMARTSIG:12 Tablet(s) By Mouth Daily 08/27/21   [provider]  Vitamin D, Ergocalciferol, (DRISDOL) 1.25 MG (50000 UNIT) CAPS capsule Take 50,000 Units by mouth once a week. 09/02/21   [provider]    Family History Family History  Problem Relation Age of Onset   Cancer Father     Social History Social  History   Tobacco Use   Smoking status: Never  Substance Use Topics   Alcohol use: Not Currently   Drug use: Never     Allergies   Codeine and Erythromycin   Review of Systems Review of Systems  Constitutional:  Negative for chills and fever.  Gastrointestinal:  Negative for abdominal pain, nausea and vomiting.  Genitourinary:  Positive for dysuria, frequency and  hematuria. Negative for flank pain, pelvic pain and vaginal discharge.  Skin:  Negative for color change and rash.  All other systems reviewed and are negative.   Physical Exam Triage Vital Signs ED Triage Vitals  Enc Vitals Group     BP      Pulse      Resp      Temp      Temp src      SpO2      Weight      Height      Head Circumference      Peak Flow      Pain Score      Pain Loc      Pain Edu?      Excl. in [place]?    No data found.  Updated Vital Signs BP 124/60   Pulse 77   Temp 98.2 F (36.8 C)   Resp 18   LMP 05/16/2014   SpO2 99%   Visual Acuity Right Eye Distance:   Left Eye Distance:   Bilateral Distance:    Right Eye Near:   Left Eye Near:    Bilateral Near:     Physical Exam Vitals and nursing note reviewed.  Constitutional:      General: She is not in acute distress.    Appearance: Normal appearance. She is well-developed. She is not ill-appearing.  HENT:     Mouth/Throat:     Mouth: Mucous membranes are moist.  Cardiovascular:     Rate and Rhythm: Normal rate and regular rhythm.     Heart sounds: Normal heart sounds.  Pulmonary:     Effort: Pulmonary effort is normal. No respiratory distress.     Breath sounds: Normal breath sounds.  Abdominal:     Palpations: Abdomen is soft.     Tenderness: There is no abdominal tenderness. There is no right CVA tenderness, left CVA tenderness, guarding or rebound.  Musculoskeletal:     Cervical back: Neck supple.  Skin:    General: Skin is warm and dry.  Neurological:     Mental Status: She is alert.  Psychiatric:        Mood and Affect: Mood normal.        Behavior: Behavior normal.     UC Treatments / Results  Labs (all labs ordered are listed, but only abnormal results are displayed) Labs Reviewed  POCT URINALYSIS DIP (MANUAL ENTRY) - Abnormal; Notable for the following components:      Result Value   Clarity, UA cloudy (*)    Blood, UA large (*)    Protein Ur, POC =100 (*)     Leukocytes, UA Moderate (2+) (*)    All other components within normal limits  URINE CULTURE    EKG   Radiology No results found.  Procedures Procedures (including critical care time)  Medications Ordered in UC Medications - No data to display  Initial Impression / Assessment and Plan / UC Course  I have reviewed the triage vital signs and the nursing notes.  Pertinent labs & imaging results that were  available during my care of the patient were reviewed by me and considered in my medical decision making (see chart for details).    UTI with hematuria.  Treating with Keflex. Urine culture pending. Discussed with patient that we will call her if the urine culture shows the need to change or discontinue the antibiotic. Instructed her to follow-up with her PCP if her symptoms are not improving. Patient agrees to plan of care.     Final Clinical Impressions(s) / UC Diagnoses   Final diagnoses:  Urinary tract infection with hematuria, site unspecified     Discharge Instructions      Take the antibiotic as directed.  The urine culture is pending.  We will call you if it shows the need to change or discontinue your antibiotic.    Follow up with your primary care provider if your symptoms are not improving.         ED Prescriptions     Medication Sig Dispense Auth. Provider   cephALEXin (KEFLEX) 500 MG capsule Take 1 capsule (500 mg total) by mouth 2 (two) times daily for 5 days. 10 capsule Sharion Balloon, NP      PDMP not reviewed this encounter.   Sharion Balloon, NP 02/23/22 1236

## 2022-02-23 NOTE — ED Triage Notes (Signed)
Onset of symptoms past Sunday.  "Uncomfortable" with urination.  Increase in urinating.

## 2022-02-23 NOTE — Discharge Instructions (Addendum)
Take the antibiotic as directed.  The urine culture is pending.  We will call you if it shows the need to change or discontinue your antibiotic.    Follow up with your primary care provider if your symptoms are not improving.    

## 2022-02-25 LAB — URINE CULTURE

## 2022-03-04 ENCOUNTER — Ambulatory Visit
Admission: EM | Admit: 2022-03-04 | Discharge: 2022-03-04 | Disposition: A | Discharge status: Home / Self Care | Payer: Federal, State, Local not specified - PPO | Attending: Emergency Medicine | Admitting: Emergency Medicine

## 2022-03-04 ENCOUNTER — Encounter: Payer: Self-pay | Admitting: Emergency Medicine

## 2022-03-04 DIAGNOSIS — R300 Dysuria: Secondary | ICD-10-CM | POA: Diagnosis present

## 2022-03-04 LAB — POCT URINALYSIS DIP (MANUAL ENTRY)
Bilirubin, UA: NEGATIVE
Glucose, UA: NEGATIVE mg/dL
Ketones, POC UA: NEGATIVE mg/dL
Nitrite, UA: NEGATIVE
Protein Ur, POC: 100 mg/dL — AB
Spec Grav, UA: >=1.03 — AB (ref 1.010–1.025)
Urobilinogen, UA: 0.2 E.U./dL
pH, UA: 5 (ref 5.0–8.0)

## 2022-03-04 MED ORDER — NITROFURANTOIN MONOHYD MACRO 100 MG PO CAPS: 100.0000 mg | ORAL_CAPSULE | Freq: Two times a day (BID) | ORAL | 0 refills | Status: AC

## 2022-03-04 NOTE — Discharge Instructions (Addendum)
Take the antibiotic as directed.  The urine culture is pending.  We will call you if it shows the need to change or discontinue your antibiotic.    Follow up with your primary care provider.    

## 2022-03-04 NOTE — ED Provider Notes (Signed)
[name]    CSN: [id number] Arrival date & time: 03/04/22  7510      History   Chief Complaint Chief Complaint  Patient presents with   Urinary Frequency   Dysuria   Hematuria    HPI Rhonda Hampton is a 51 y.o. female.  Patient presents with 3-day history of dysuria, urinary frequency, hematuria.  She was seen here on 02/23/2022; diagnosed with UTI; treated with Keflex; urine culture grew multiple species.  She states her symptoms resolved but then returned 3 days ago and got worse last night.  She denies fever, chills, abdominal pain, flank pain, vaginal discharge, pelvic pain, or other symptoms.  The history is provided by the patient and medical records.   History reviewed. No pertinent past medical history.  Patient Active Problem List   Diagnosis Date Noted   Fibroids 12/15/2018   Hot flashes 12/15/2018   Intramural uterine fibroid 12/15/2018   Oral contraceptive pill surveillance 12/15/2018   Pelvic pain 12/15/2018   Anxiety 04/14/2017   Depression 04/14/2017   Class 2 obesity due to excess calories without serious comorbidity with body mass index (BMI) of 38.0 to 38.9 in adult 04/14/2017   Hearing loss 04/14/2017   Migraines 04/14/2017   Cholelithiasis without cholecystitis 03/12/2016   History of retinal detachment 02/19/2016   Hyperlipidemia LDL goal <130 12/02/2014   Arthropathy, unspecified, other specified sites 07/02/2014   Kidney stone on left side 05/27/2014   Allergic rhinitis 05/16/2014   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 No pertinent surgical history.  OB History   No obstetric history on file.      Home Medications    Prior to Admission medications   Medication Sig Start Date  End Date Taking? Authorizing Provider  nitrofurantoin, macrocrystal-monohydrate, (MACROBID) 100 MG capsule Take 1 capsule (100 mg total) by mouth 2 (two) times daily. 03/04/22  Yes Sharion Balloon, NP  atorvastatin (LIPITOR) 20 MG tablet Take 20 mg by mouth daily. 11/26/21   [provider]  celecoxib (CELEBREX) 200 MG capsule Take by mouth. 05/25/21   [provider]  clonazePAM (KLONOPIN) 0.5 MG tablet Take by mouth. 11/28/14   [provider]  escitalopram (LEXAPRO) 10 MG tablet Take 10 mg by mouth daily. 11/08/21   [provider]  levothyroxine (SYNTHROID) 50 MCG tablet Take 50 mcg by mouth daily. 11/02/21   [provider]  norethindrone (MICRONOR) 0.35 MG tablet Take by mouth. Patient not taking: Reported on 02/23/2022 11/08/19   [provider]  oxyCODONE-acetaminophen (PERCOCET) 5-325 MG per tablet Take 1-2 tablets by mouth every 6 (six) hours as needed for severe pain. Patient not taking: Reported on 02/23/2022 05/25/14   Palumbo, April, MD  promethazine-dextromethorphan (PROMETHAZINE-DM) 6.25-15 MG/5ML syrup Take 5 mLs by mouth 3 (three) times daily as needed for cough. 12/02/21   Scot Jun, FNP  SUMAtriptan (IMITREX) 100 MG tablet Take by mouth. 09/27/16 01/22/22  [provider]  SUTAB [phone] MG TABS SMARTSIG:12 Tablet(s) By Mouth Daily 08/27/21   [provider]  Vitamin D, Ergocalciferol, (DRISDOL) 1.25 MG (50000 UNIT) CAPS capsule Take 50,000 Units by mouth once a week. 09/02/21   [provider]    Family History Family History  Problem Relation Age of Onset  Cancer Father     Social History Social History   Tobacco Use   Smoking status: Never  Substance Use Topics   Alcohol use: Not Currently   Drug use: Never     Allergies   Codeine and Erythromycin   Review of Systems Review of Systems  Constitutional:  Negative for chills and fever.  Gastrointestinal:  Negative for abdominal  pain.  Genitourinary:  Positive for dysuria, frequency and hematuria. Negative for flank pain, pelvic pain and vaginal discharge.  All other systems reviewed and are negative.   Physical Exam Triage Vital Signs ED Triage Vitals  Enc Vitals Group     BP      Pulse      Resp      Temp      Temp src      SpO2      Weight      Height      Head Circumference      Peak Flow      Pain Score      Pain Loc      Pain Edu?      Excl. in [place]?    No data found.  Updated Vital Signs BP (!) 116/57   Pulse 76   Temp 98.2 F (36.8 C)   Resp 18   LMP 05/16/2014   SpO2 98%   Visual Acuity Right Eye Distance:   Left Eye Distance:   Bilateral Distance:    Right Eye Near:   Left Eye Near:    Bilateral Near:     Physical Exam Vitals and nursing note reviewed.  Constitutional:      General: She is not in acute distress.    Appearance: Normal appearance. She is well-developed. She is not ill-appearing.  HENT:     Mouth/Throat:     Mouth: Mucous membranes are moist.  Cardiovascular:     Rate and Rhythm: Normal rate and regular rhythm.     Heart sounds: Normal heart sounds.  Pulmonary:     Effort: Pulmonary effort is normal. No respiratory distress.     Breath sounds: Normal breath sounds.  Abdominal:     Palpations: Abdomen is soft.     Tenderness: There is no abdominal tenderness. There is no right CVA tenderness, left CVA tenderness, guarding or rebound.  Musculoskeletal:     Cervical back: Neck supple.  Skin:    General: Skin is warm and dry.  Neurological:     Mental Status: She is alert.  Psychiatric:        Mood and Affect: Mood normal.        Behavior: Behavior normal.     UC Treatments / Results  Labs (all labs ordered are listed, but only abnormal results are displayed) Labs Reviewed  POCT URINALYSIS DIP (MANUAL ENTRY) - Abnormal; Notable for the following components:      Result Value   Clarity, UA cloudy (*)    Spec Grav, UA >=1.030 (*)    Blood, UA  large (*)    Protein Ur, POC =100 (*)    Leukocytes, UA Moderate (2+) (*)    All other components within normal limits  URINE CULTURE    EKG   Radiology No results found.  Procedures Procedures (including critical care time)  Medications Ordered in UC Medications - No data to display  Initial Impression / Assessment and Plan / UC Course  I have reviewed the triage vital signs and the nursing notes.  Pertinent  labs & imaging results that were available during my care of the patient were reviewed by me and considered in my medical decision making (see chart for details).    Dysuria.  Patient was treated with Keflex on 02/23/2022; urine culture grew out multiple species.  Her symptoms resolved but then returned to 3 days ago.  Treating today with Macrobid. Urine culture pending. Discussed with patient that we will call her if the urine culture shows the need to change or discontinue the antibiotic. Instructed her to follow-up with her PCP. Patient agrees to plan of care.     Final Clinical Impressions(s) / UC Diagnoses   Final diagnoses:  Dysuria     Discharge Instructions      Take the antibiotic as directed.  The urine culture is pending.  We will call you if it shows the need to change or discontinue your antibiotic.    Follow up with your primary care provider.        ED Prescriptions     Medication Sig Dispense Auth. Provider   nitrofurantoin, macrocrystal-monohydrate, (MACROBID) 100 MG capsule Take 1 capsule (100 mg total) by mouth 2 (two) times daily. 10 capsule Sharion Balloon, NP      PDMP not reviewed this encounter.   Sharion Balloon, NP 03/04/22 [phone]

## 2022-03-04 NOTE — ED Triage Notes (Signed)
Pt reports urinary frequency, dysuria, and hematuria. States was seen here on 5/20 and was prescribed a 5 day tx of antibiotics and symptoms have not improved.

## 2022-03-05 ENCOUNTER — Ambulatory Visit: Payer: Federal, State, Local not specified - PPO

## 2022-03-06 ENCOUNTER — Telehealth (HOSPITAL_COMMUNITY): Payer: Self-pay | Admitting: Emergency Medicine

## 2022-03-06 LAB — URINE CULTURE: Culture: 20000 — AB

## 2022-03-06 MED ORDER — SULFAMETHOXAZOLE-TRIMETHOPRIM 800-160 MG PO TABS: 1.0000 | ORAL_TABLET | Freq: Two times a day (BID) | ORAL | 0 refills | Status: AC

## 2022-06-21 ENCOUNTER — Ambulatory Visit
Admission: RE | Admit: 2022-06-21 | Discharge: 2022-06-21 | Disposition: A | Discharge status: Home / Self Care | Payer: Federal, State, Local not specified - PPO | Source: Ambulatory Visit | Attending: Urgent Care | Admitting: Urgent Care

## 2022-06-21 VITALS — BP 110/71 | HR 64 | Temp 98.0°F | Resp 17

## 2022-06-21 DIAGNOSIS — R21 Rash and other nonspecific skin eruption: Secondary | ICD-10-CM | POA: Diagnosis not present

## 2022-06-21 MED ORDER — VALACYCLOVIR HCL 1 G PO TABS: 1000.0000 mg | ORAL_TABLET | Freq: Three times a day (TID) | ORAL | 0 refills | Status: AC

## 2022-06-21 NOTE — ED Triage Notes (Signed)
Pt. States she has had a rash on left upper arm for the past 3-4 weeks. She has ben treating herself w/ OTC ointment w/ no relief.

## 2022-06-21 NOTE — ED Provider Notes (Signed)
Rhonda Hampton    CSN: [id number] Arrival date & time: 06/21/22  1701      History   Chief Complaint Chief Complaint  Patient presents with   Rash    I have a rash on upper left arm that won't go away with over the counter treatment - Entered by patient    HPI Rhonda Hampton is a 51 y.o. female.    Rash  Rhonda Hampton presents to urgent care with a rash on her left upper arm x3 to 4 weeks.  She denies contact with any allergens.  No new soaps or lotions.  No detergents.  Rash is only on her left upper arm.  Denies any history of similar.  Patient states she has had shingles vaccination.  History reviewed. No pertinent past medical history.  Patient Active Problem List   Diagnosis Date Noted   Fibroids 12/15/2018   Hot flashes 12/15/2018   Intramural uterine fibroid 12/15/2018   Oral contraceptive pill surveillance 12/15/2018   Pelvic pain 12/15/2018   Anxiety 04/14/2017   Depression 04/14/2017   Class 2 obesity due to excess calories without serious comorbidity with body mass index (BMI) of 38.0 to 38.9 in adult 04/14/2017   Hearing loss 04/14/2017   Migraines 04/14/2017   Cholelithiasis without cholecystitis 03/12/2016   History of retinal detachment 02/19/2016   Hyperlipidemia LDL goal <130 12/02/2014   Arthropathy, unspecified, other specified sites 07/02/2014   Kidney stone on left side 05/27/2014   Allergic rhinitis 05/16/2014   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 No pertinent surgical history.  OB History   No obstetric history on file.      Home Medications    Prior to Admission medications   Medication Sig Start Date End Date Taking? Authorizing Provider  valACYclovir (VALTREX) 1000 MG tablet Take 1  tablet (1,000 mg total) by mouth 3 (three) times daily for 7 days. 06/21/22 06/28/22 Yes Even Budlong, Annie Main, FNP  atorvastatin (LIPITOR) 20 MG tablet Take 20 mg by mouth daily. 11/26/21   [provider]  celecoxib (CELEBREX) 200 MG capsule Take by mouth. 05/25/21   [provider]  clonazePAM (KLONOPIN) 0.5 MG tablet Take by mouth. 11/28/14   [provider]  escitalopram (LEXAPRO) 10 MG tablet Take 10 mg by mouth daily. 11/08/21   [provider]  levothyroxine (SYNTHROID) 50 MCG tablet Take 50 mcg by mouth daily. 11/02/21   [provider]  nitrofurantoin, macrocrystal-monohydrate, (MACROBID) 100 MG capsule Take 1 capsule (100 mg total) by mouth 2 (two) times daily. 03/04/22   Sharion Balloon, NP  norethindrone (MICRONOR) 0.35 MG tablet Take by mouth. Patient not taking: Reported on 02/23/2022 11/08/19   [provider]  oxyCODONE-acetaminophen (PERCOCET) 5-325 MG per tablet Take 1-2 tablets by mouth every 6 (six) hours as needed for severe pain. Patient not taking: Reported on 02/23/2022 05/25/14   Palumbo, April, MD  promethazine-dextromethorphan (PROMETHAZINE-DM) 6.25-15 MG/5ML syrup Take 5 mLs by mouth 3 (three) times daily as needed for cough. 12/02/21   Scot Jun, FNP  SUMAtriptan (IMITREX) 100 MG tablet Take by mouth. 09/27/16 01/22/22  [provider]  SUTAB [phone] MG TABS SMARTSIG:12 Tablet(s) By Mouth Daily 08/27/21   [provider]  Vitamin D, Ergocalciferol, (DRISDOL) 1.25 MG (50000 UNIT)  CAPS capsule Take 50,000 Units by mouth once a week. 09/02/21   [provider]    Family History Family History  Problem Relation Age of Onset   Cancer Father     Social History Social History   Tobacco Use   Smoking status: Never  Substance Use Topics   Alcohol use: Not Currently   Drug use: Never     Allergies   Codeine and Erythromycin   Review of Systems Review of Systems  Skin:  Positive for  rash.     Physical Exam Triage Vital Signs ED Triage Vitals [06/21/22 1710]  Enc Vitals Group     BP 110/71     Pulse Rate 64     Resp 17     Temp 98 F (36.7 C)     Temp src      SpO2 97 %     Weight      Height      Head Circumference      Peak Flow      Pain Score 5     Pain Loc      Pain Edu?      Excl. in [place]?    No data found.  Updated Vital Signs BP 110/71   Pulse 64   Temp 98 F (36.7 C)   Resp 17   LMP 05/16/2014   SpO2 97%   Visual Acuity Right Eye Distance:   Left Eye Distance:   Bilateral Distance:    Right Eye Near:   Left Eye Near:    Bilateral Near:     Physical Exam Skin:    General: Skin is warm and dry.     Findings: Rash present.       Neurological:     General: No focal deficit present.     Mental Status: She is oriented to person, place, and time.  Psychiatric:        Mood and Affect: Mood normal.        Behavior: Behavior normal.      UC Treatments / Results  Labs (all labs ordered are listed, but only abnormal results are displayed) Labs Reviewed - No data to display  EKG   Radiology No results found.  Procedures Procedures (including critical care time)  Medications Ordered in UC Medications - No data to display  Initial Impression / Assessment and Plan / UC Course  I have reviewed the triage vital signs and the nursing notes.  Pertinent labs & imaging results that were available during my care of the patient were reviewed by me and considered in my medical decision making (see chart for details).   Unclear etiology for her rash.  Lesions appear herpetic similar to what I would expect with shingles however duration approaching 1 month.  Will attempt treatment with antiviral.  Recommended patient be evaluated in dermatology if symptoms do not resolve with treatment.   Final Clinical Impressions(s) / UC Diagnoses   Final diagnoses:  Rash and nonspecific skin eruption     Discharge Instructions       He is follow-up here, in primary care, or in dermatology if your symptoms do not resolve with treatment.     ED Prescriptions     Medication Sig Dispense Auth. Provider   valACYclovir (VALTREX) 1000 MG tablet Take 1 tablet (1,000 mg total) by mouth 3 (three) times daily for 7 days. 21 tablet Muneer Leider, FNP      PDMP not reviewed this encounter.  Rose Phi, [place] 06/21/22 1729

## 2022-06-21 NOTE — Discharge Instructions (Signed)
He is follow-up here, in primary care, or in dermatology if your symptoms do not resolve with treatment.

## 2023-01-11 ENCOUNTER — Ambulatory Visit: Payer: Self-pay

## 2023-01-21 ENCOUNTER — Ambulatory Visit
Admission: EM | Admit: 2023-01-21 | Discharge: 2023-01-21 | Disposition: A | Discharge status: Home / Self Care | Payer: Federal, State, Local not specified - PPO | Attending: Emergency Medicine | Admitting: Emergency Medicine

## 2023-01-21 ENCOUNTER — Ambulatory Visit (INDEPENDENT_AMBULATORY_CARE_PROVIDER_SITE_OTHER): Payer: Federal, State, Local not specified - PPO

## 2023-01-21 DIAGNOSIS — M79672 Pain in left foot: Secondary | ICD-10-CM

## 2023-01-21 NOTE — Discharge Instructions (Addendum)
Take Tylenol or ibuprofen as needed.  Rest and elevate your foot.  Apply ice packs 2-3 times a day for up to 20 minutes each.  Follow up with an orthopedist if your symptoms are not improving.

## 2023-01-21 NOTE — ED Provider Notes (Signed)
Rhonda Hampton    CSN: [id number] Arrival date & time: 01/21/23  1648      History   Chief Complaint Chief Complaint  Patient presents with   Foot Pain    Pain on outside of left foot and painful to walk - Entered by patient    HPI Rhonda Hampton is a 52 y.o. female.  Patient presents with 1 month history of pain in her left foot.  No falls or injury.  Treatment attempted with purchase of new supportive shoes, ibuprofen, rest, elevation.  The pain is worse with prolonged standing or walking.  No wounds, redness, bruising, swelling, numbness, weakness, or other symptoms.    The history is provided by the patient and medical records.    History reviewed. No pertinent past medical history.  Patient Active Problem List   Diagnosis Date Noted   Fibroids 12/15/2018   Hot flashes 12/15/2018   Intramural uterine fibroid 12/15/2018   Oral contraceptive pill surveillance 12/15/2018   Pelvic pain 12/15/2018   Anxiety 04/14/2017   Depression 04/14/2017   Class 2 obesity due to excess calories without serious comorbidity with body mass index (BMI) of 38.0 to 38.9 in adult 04/14/2017   Hearing loss 04/14/2017   Migraines 04/14/2017   Cholelithiasis without cholecystitis 03/12/2016   History of retinal detachment 02/19/2016   Hyperlipidemia LDL goal <130 12/02/2014   Arthropathy, unspecified, other specified sites 07/02/2014   Kidney stone on left side 05/27/2014   Allergic rhinitis 05/16/2014   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 No pertinent surgical history.  OB History   No obstetric history on file.      Home Medications    Prior to Admission medications   Medication Sig Start Date End Date Taking? Authorizing Provider   atorvastatin (LIPITOR) 20 MG tablet Take 20 mg by mouth daily. 11/26/21   [provider]  celecoxib (CELEBREX) 200 MG capsule Take by mouth. 05/25/21   [provider]  clonazePAM (KLONOPIN) 0.5 MG tablet Take by mouth. 11/28/14   [provider]  escitalopram (LEXAPRO) 10 MG tablet Take 10 mg by mouth daily. 11/08/21   [provider]  levothyroxine (SYNTHROID) 50 MCG tablet Take 50 mcg by mouth daily. 11/02/21   [provider]  nitrofurantoin, macrocrystal-monohydrate, (MACROBID) 100 MG capsule Take 1 capsule (100 mg total) by mouth 2 (two) times daily. 03/04/22   Mickie Bail, NP  norethindrone (MICRONOR) 0.35 MG tablet Take by mouth. Patient not taking: Reported on 02/23/2022 11/08/19   [provider]  oxyCODONE-acetaminophen (PERCOCET) 5-325 MG per tablet Take 1-2 tablets by mouth every 6 (six) hours as needed for severe pain. Patient not taking: Reported on 02/23/2022 05/25/14   Palumbo, April, MD  promethazine-dextromethorphan (PROMETHAZINE-DM) 6.25-15 MG/5ML syrup Take 5 mLs by mouth 3 (three) times daily as needed for cough. 12/02/21   Bing Neighbors, NP  SUMAtriptan (IMITREX) 100 MG tablet Take by mouth. 09/27/16 01/22/22  [provider]  SUTAB [phone] MG TABS SMARTSIG:12 Tablet(s) By Mouth Daily 08/27/21   [provider]  Vitamin D, Ergocalciferol, (DRISDOL) 1.25 MG (50000 UNIT) CAPS capsule Take 50,000 Units by mouth once a week. 09/02/21   [provider]    Family History Family History  Problem Relation Age of Onset  Cancer Father     Social History Social History   Tobacco Use   Smoking status: Never  Substance Use Topics   Alcohol use: Not Currently   Drug use: Never     Allergies   Venlafaxine, Codeine, and Erythromycin   Review of Systems Review of Systems  Constitutional:  Negative for chills and fever.  Musculoskeletal:  Positive for arthralgias. Negative for gait problem  and joint swelling.  Skin:  Negative for color change and wound.  Neurological:  Negative for weakness and numbness.     Physical Exam Triage Vital Signs ED Triage Vitals [01/21/23 1649]  Enc Vitals Group     BP      Pulse Rate 70     Resp 18     Temp 97.6 F (36.4 C)     Temp src      SpO2 95 %     Weight      Height      Head Circumference      Peak Flow      Pain Score      Pain Loc      Pain Edu?      Excl. in [place]?    No data found.  Updated Vital Signs BP 123/79 (BP Location: Left Arm)   Pulse 70   Temp 97.6 F (36.4 C)   Resp 18   LMP 05/16/2014   SpO2 95%   Visual Acuity Right Eye Distance:   Left Eye Distance:   Bilateral Distance:    Right Eye Near:   Left Eye Near:    Bilateral Near:     Physical Exam Vitals and nursing note reviewed.  Constitutional:      General: She is not in acute distress.    Appearance: Normal appearance. She is well-developed. She is not ill-appearing.  HENT:     Mouth/Throat:     Mouth: Mucous membranes are moist.  Cardiovascular:     Rate and Rhythm: Normal rate and regular rhythm.  Pulmonary:     Effort: Pulmonary effort is normal. No respiratory distress.  Musculoskeletal:        General: Tenderness present. No swelling, deformity or signs of injury. Normal range of motion.     Cervical back: Neck supple.       Feet:  Skin:    General: Skin is warm and dry.     Capillary Refill: Capillary refill takes less than 2 seconds.     Findings: No bruising, erythema, lesion or rash.  Neurological:     General: No focal deficit present.     Mental Status: She is alert and oriented to person, place, and time.     Sensory: No sensory deficit.     Motor: No weakness.     Gait: Gait normal.  Psychiatric:        Mood and Affect: Mood normal.        Behavior: Behavior normal.      UC Treatments / Results  Labs (all labs ordered are listed, but only abnormal results are displayed) Labs Reviewed - No data to  display  EKG   Radiology DG Foot Complete Left  Result Date: 01/21/2023 CLINICAL DATA:  Left foot pain for 1 month, worse with walking. No known injury. EXAM: LEFT FOOT - COMPLETE 3+ VIEW COMPARISON:  None Available. FINDINGS: The mineralization and alignment are normal. There is no evidence of acute fracture or dislocation. The joint spaces appear preserved. No focal soft tissue abnormalities  are seen. IMPRESSION: No acute osseous findings or significant arthropathic changes. Electronically Signed   By: Carey Bullocks M.D.   On: 01/21/2023 17:28    Procedures Procedures (including critical care time)  Medications Ordered in UC Medications - No data to display  Initial Impression / Assessment and Plan / UC Course  I have reviewed the triage vital signs and the nursing notes.  Pertinent labs & imaging results that were available during my care of the patient were reviewed by me and considered in my medical decision making (see chart for details).    Left foot pain.  Xray negative.  Treating with rest, elevation, ice packs, ibuprofen or tylenol.  Education provided on foot pain.  Instructed patient to follow-up with orthopedics if her symptoms are not improving.  Contact information for on-call Ortho provided.  Patient agrees to plan of care.   Final Clinical Impressions(s) / UC Diagnoses   Final diagnoses:  Left foot pain     Discharge Instructions      Take Tylenol or ibuprofen as needed.  Rest and elevate your foot.  Apply ice packs 2-3 times a day for up to 20 minutes each.  Follow up with an orthopedist if your symptoms are not improving.        ED Prescriptions   None    I have reviewed the PDMP during this encounter.   Mickie Bail, NP 01/21/23 [phone]

## 2023-01-21 NOTE — ED Triage Notes (Signed)
Patient presents to UC for left foot pain x 1 month. States pain worse with walking, pain located on the outer part of foot. Taking ibuprofen and advil.

## 2023-02-18 ENCOUNTER — Ambulatory Visit: Payer: Federal, State, Local not specified - PPO | Admitting: Podiatry

## 2023-02-27 ENCOUNTER — Ambulatory Visit: Payer: Federal, State, Local not specified - PPO | Admitting: Podiatry

## 2023-03-06 ENCOUNTER — Ambulatory Visit: Payer: Federal, State, Local not specified - PPO | Admitting: Podiatry

## 2023-03-11 ENCOUNTER — Ambulatory Visit: Payer: Federal, State, Local not specified - PPO | Admitting: Podiatry

## 2023-03-11 DIAGNOSIS — M7672 Peroneal tendinitis, left leg: Secondary | ICD-10-CM | POA: Diagnosis not present

## 2023-03-11 NOTE — Progress Notes (Signed)
Subjective:  Patient ID: Rhonda Hampton, female    DOB: January 29, 1971,  MRN: [id number]  Chief Complaint  Patient presents with   Foot Pain    Left foot pain on the outside of the foot tender to the touch no known injury. Has been going on for a few months had xrays taken 4/16, still having pain     52 y.o. female presents with the above complaint.  Patient presents with left lateral foot pain that has been on for quite some time is progressive gotten worse worse with ambulation worse with pressure she went to urgent care and did not have any fracture.  Pain scale 7 out of 10 dull achy in nature hurts at the end of the day.  Pain scale 7 out of 10   Review of Systems: Negative except as noted in the HPI. Denies N/V/F/Ch.  No past medical history on file.  Current Outpatient Medications:    atorvastatin (LIPITOR) 20 MG tablet, Take 20 mg by mouth daily., Disp: , Rfl:    celecoxib (CELEBREX) 200 MG capsule, Take by mouth., Disp: , Rfl:    clonazePAM (KLONOPIN) 0.5 MG tablet, Take by mouth., Disp: , Rfl:    escitalopram (LEXAPRO) 10 MG tablet, Take 10 mg by mouth daily., Disp: , Rfl:    levothyroxine (SYNTHROID) 50 MCG tablet, Take 50 mcg by mouth daily., Disp: , Rfl:    nitrofurantoin, macrocrystal-monohydrate, (MACROBID) 100 MG capsule, Take 1 capsule (100 mg total) by mouth 2 (two) times daily., Disp: 10 capsule, Rfl: 0   norethindrone (MICRONOR) 0.35 MG tablet, Take by mouth. (Patient not taking: Reported on 02/23/2022), Disp: , Rfl:    oxyCODONE-acetaminophen (PERCOCET) 5-325 MG per tablet, Take 1-2 tablets by mouth every 6 (six) hours as needed for severe pain. (Patient not taking: Reported on 02/23/2022), Disp: 10 tablet, Rfl: 0   promethazine-dextromethorphan (PROMETHAZINE-DM) 6.25-15 MG/5ML syrup, Take 5 mLs by mouth 3 (three) times daily as needed for cough., Disp: 180 mL, Rfl: 0   SUMAtriptan (IMITREX) 100 MG tablet, Take by mouth., Disp: , Rfl:    SUTAB [id number] MG TABS,  SMARTSIG:12 Tablet(s) By Mouth Daily, Disp: , Rfl:    Vitamin D, Ergocalciferol, (DRISDOL) 1.25 MG (50000 UNIT) CAPS capsule, Take 50,000 Units by mouth once a week., Disp: , Rfl:   Social History   Tobacco Use  Smoking Status Never  Smokeless Tobacco Not on file    Allergies  Allergen Reactions   Venlafaxine Other (See Comments)    insomnia  insomnia  insomnia  insomnia  insomnia  insomnia  insomnia   Codeine    Erythromycin Hives   Objective:  There were no vitals filed for this visit. There is no height or weight on file to calculate BMI. Constitutional Well developed. Well nourished.  Vascular Dorsalis pedis pulses palpable bilaterally. Posterior tibial pulses palpable bilaterally. Capillary refill normal to all digits.  No cyanosis or clubbing noted. Pedal hair growth normal.  Neurologic Normal speech. Oriented to person, place, and time. Epicritic sensation to light touch grossly present bilaterally.  Dermatologic Nails well groomed and normal in appearance. No open wounds. No skin lesions.  Orthopedic: Pain on palpation left lateral foot at along the course of the peroneal tendon including the insertion.  No pain at the Achilles tendon posterior tibial tendon ATFL ligament.  Pain with resisted dorsiflexion eversion of the foot.   Radiographs: IMPRESSION: No acute osseous findings or significant arthropathic changes. Assessment:   1. Peroneal tendinitis of left  lower extremity    Plan:  Patient was evaluated and treated and all questions answered.  Left peroneal tendinitis -All questions and concerns were discussed with the patient in extensive detail given the amount of pain that she is experiencing she will benefit from cam boot immobilization Cam boot was dispensed if there is no resolve no discussed her injection versus MRI.  She states understanding  No follow-ups on file.

## 2023-04-08 ENCOUNTER — Ambulatory Visit: Payer: Federal, State, Local not specified - PPO | Admitting: Podiatry

## 2023-04-14 ENCOUNTER — Encounter: Payer: Self-pay | Admitting: Pediatrics

## 2023-09-16 ENCOUNTER — Ambulatory Visit: Payer: Federal, State, Local not specified - PPO
